# Patient Record
Sex: Male | Born: 1960 | Race: White | Hispanic: No | Marital: Married | Smoking: Current every day smoker
Health system: Southern US, Community
[De-identification: ages and names within clinical notes are randomized; demographics above are authoritative.]

## PROBLEM LIST (undated history)

## (undated) DIAGNOSIS — G709 Myoneural disorder, unspecified: Secondary | ICD-10-CM

## (undated) DIAGNOSIS — G629 Polyneuropathy, unspecified: Secondary | ICD-10-CM

## (undated) HISTORY — DX: Polyneuropathy, unspecified: G62.9

## (undated) HISTORY — DX: Myoneural disorder, unspecified: G70.9

---

## 2006-06-28 ENCOUNTER — Ambulatory Visit (HOSPITAL_BASED_OUTPATIENT_CLINIC_OR_DEPARTMENT_OTHER): Admission: RE | Admit: 2006-06-28 | Discharge: 2006-06-28 | Payer: Self-pay | Admitting: Family Medicine

## 2006-07-04 ENCOUNTER — Ambulatory Visit: Payer: Self-pay | Admitting: Internal Medicine

## 2006-08-27 ENCOUNTER — Ambulatory Visit (HOSPITAL_COMMUNITY): Admission: RE | Admit: 2006-08-27 | Discharge: 2006-08-27 | Payer: Self-pay | Admitting: Family Medicine

## 2009-07-05 ENCOUNTER — Observation Stay (HOSPITAL_COMMUNITY): Admission: AD | Admit: 2009-07-05 | Discharge: 2009-07-06 | Payer: Self-pay | Admitting: Internal Medicine

## 2009-07-05 ENCOUNTER — Ambulatory Visit: Payer: Self-pay | Admitting: Diagnostic Radiology

## 2009-07-05 ENCOUNTER — Encounter: Payer: Self-pay | Admitting: Emergency Medicine

## 2009-07-06 ENCOUNTER — Encounter (INDEPENDENT_AMBULATORY_CARE_PROVIDER_SITE_OTHER): Payer: Self-pay | Admitting: Internal Medicine

## 2010-04-14 LAB — BASIC METABOLIC PANEL
BUN: 10 mg/dL (ref 6–23)
BUN: 11 mg/dL (ref 6–23)
CO2: 25 mEq/L (ref 19–32)
Chloride: 103 mEq/L (ref 96–112)
Chloride: 106 mEq/L (ref 96–112)
Creatinine, Ser: 0.75 mg/dL (ref 0.4–1.5)
GFR calc Af Amer: >60 mL/min (ref >60)
GFR calc non Af Amer: >60 mL/min (ref >60)
Glucose, Bld: 143 mg/dL — ABNORMAL HIGH (ref 70–99)
Glucose, Bld: 94 mg/dL (ref 70–99)
Potassium: 3.9 mEq/L (ref 3.5–5.1)
Potassium: 4.2 mEq/L (ref 3.5–5.1)
Sodium: 142 mEq/L (ref 135–145)

## 2010-04-14 LAB — CARDIAC PANEL(CRET KIN+CKTOT+MB+TROPI)
CK, MB: 1.1 ng/mL (ref 0.3–4.0)
Relative Index: INVALID (ref 0.0–2.5)
Troponin I: 0.02 ng/mL (ref 0.00–0.06)
Troponin I: <0.01 ng/mL (ref 0.00–0.06)

## 2010-04-14 LAB — POCT CARDIAC MARKERS
CKMB, poc: <1 ng/mL — ABNORMAL LOW (ref 1.0–8.0)
Myoglobin, poc: 46.3 ng/mL (ref 12–200)
Troponin i, poc: <0.05 ng/mL (ref 0.00–0.09)

## 2010-04-14 LAB — LIPID PANEL
HDL: 27 mg/dL — ABNORMAL LOW (ref >39)
Total CHOL/HDL Ratio: 6.3 RATIO
Triglycerides: 619 mg/dL — ABNORMAL HIGH (ref <150)
VLDL: UNDETERMINED mg/dL (ref 0–40)

## 2010-04-14 LAB — DIFFERENTIAL
Basophils Absolute: 0.2 10*3/uL — ABNORMAL HIGH (ref 0.0–0.1)
Eosinophils Absolute: 0.2 10*3/uL (ref 0.0–0.7)
Eosinophils Relative: 2 % (ref 0–5)
Neutro Abs: 6.1 10*3/uL (ref 1.7–7.7)
Neutrophils Relative %: 71 % (ref 43–77)

## 2010-04-14 LAB — CBC
HCT: 41.7 % (ref 39.0–52.0)
HCT: 44.1 % (ref 39.0–52.0)
Hemoglobin: 14.3 g/dL (ref 13.0–17.0)
MCHC: 34.7 g/dL (ref 30.0–36.0)
MCV: 88 fL (ref 78.0–100.0)
MCV: 89.6 fL (ref 78.0–100.0)
Platelets: 208 10*3/uL (ref 150–400)
RBC: 4.66 MIL/uL (ref 4.22–5.81)
WBC: 9.5 10*3/uL (ref 4.0–10.5)

## 2010-04-14 LAB — TSH: TSH: 2.219 u[IU]/mL (ref 0.350–4.500)

## 2010-06-10 NOTE — Procedures (Signed)
NAME:  Glenn Estrada, Glenn Estrada               ACCOUNT NO.:  [id number]   MEDICAL RECORD NO.:  [id number]          PATIENT TYPE:  OUT   LOCATION:  SLEEP CENTER                 FACILITY:  [facility]   PHYSICIAN:  Clinton D. Maple Hudson, MD, FCCP, FACPDATE OF BIRTH:  1960-08-16   DATE OF STUDY:  06/28/2006                            NOCTURNAL POLYSOMNOGRAM   REFERRING PHYSICIAN:  Windle Guard, M.D.   INDICATION FOR STUDY:  Hypersomnia with sleep apnea.   EPWORTH SLEEPINESS SCORE:  11/24, BMI 36.3, weight 260 pounds.   MEDICATIONS:  No medications listed.   SLEEP ARCHITECTURE:  Total sleep time 341 minutes with sleep efficiency  81%.  Stage I was 11%, stage II 66%, stages III and IV absent, REM 22%  of total sleep time.  Sleep latency 21 minutes, REM latency 116 minutes,  awake after sleep onset 64 minutes, arousal index 9.5.  No bedtime  medication was taken.   RESPIRATORY DATA:  Split study protocol.  Apnea-hypopnea index (AHI,  RDI) 15.5 obstructive events per hour, indicating mild to moderate  obstructive sleep apnea/hypopnea syndrome before CPAP.  This includes 1  obstructive apnea and 37 hypopneas before CPAP.  The events were  positional, more common while supine.  REM AHI 24.3.  CPAP titration was  performed to a final pressure of 16 CWP, AHI 0 per hour.  There was  intermittent breakthrough snoring and residual obstructive events to  this final pressure, although pressures of 5 CWP and higher  substantially reduce the number of obstructive events.  A medium ResMed  Quattro mask was used with heated humidifier.   OXYGEN DATA:  Moderate to occasionally loud snoring with oxygen  desaturation to nadir of 87%.  Mean oxygen saturation after CPAP control  was 91-95% on room air.   CARDIAC DATA:  Normal sinus rhythm with occasional PAC.   MOVEMENT-PARASOMNIA:  Occasional limb jerk with arousal, nonsignificant.  No bathroom trips.   IMPRESSIONS-RECOMMENDATIONS:  1. Mild to moderate obstructive  sleep apnea/hypopnea syndrome, apnea-      hypopnea index 15.5 per hour.  Events were more common while      supine.  Moderate to occasionally loud snoring with oxygen      desaturation to a nadir of 87%.  2. CPAP was titrated to 16 CWP for complete control of snoring and      apneic events.  A medium ResMed Quattro      mask was used with heated humidifier.  Lower pressures may be      tried, if necessary, for patient comfort, accepting some      breakthrough snoring.      Clinton D. Maple Hudson, MD, [facility], FACP  Diplomate, Biomedical engineer of Sleep Medicine  Electronically Signed     CDY/MEDQ  D:  07/04/2006 10:14:10  T:  07/04/2006 15:14:55  Job:  [id number]

## 2011-03-13 IMAGING — CR DG CHEST 1V PORT
1 series · 1 of 1 positions shown · non-contrast
Comparison: None.

CLINICAL DATA: Midsternal chest pain, some nausea

PORTABLE CHEST - 1 VIEW

[view not recorded]
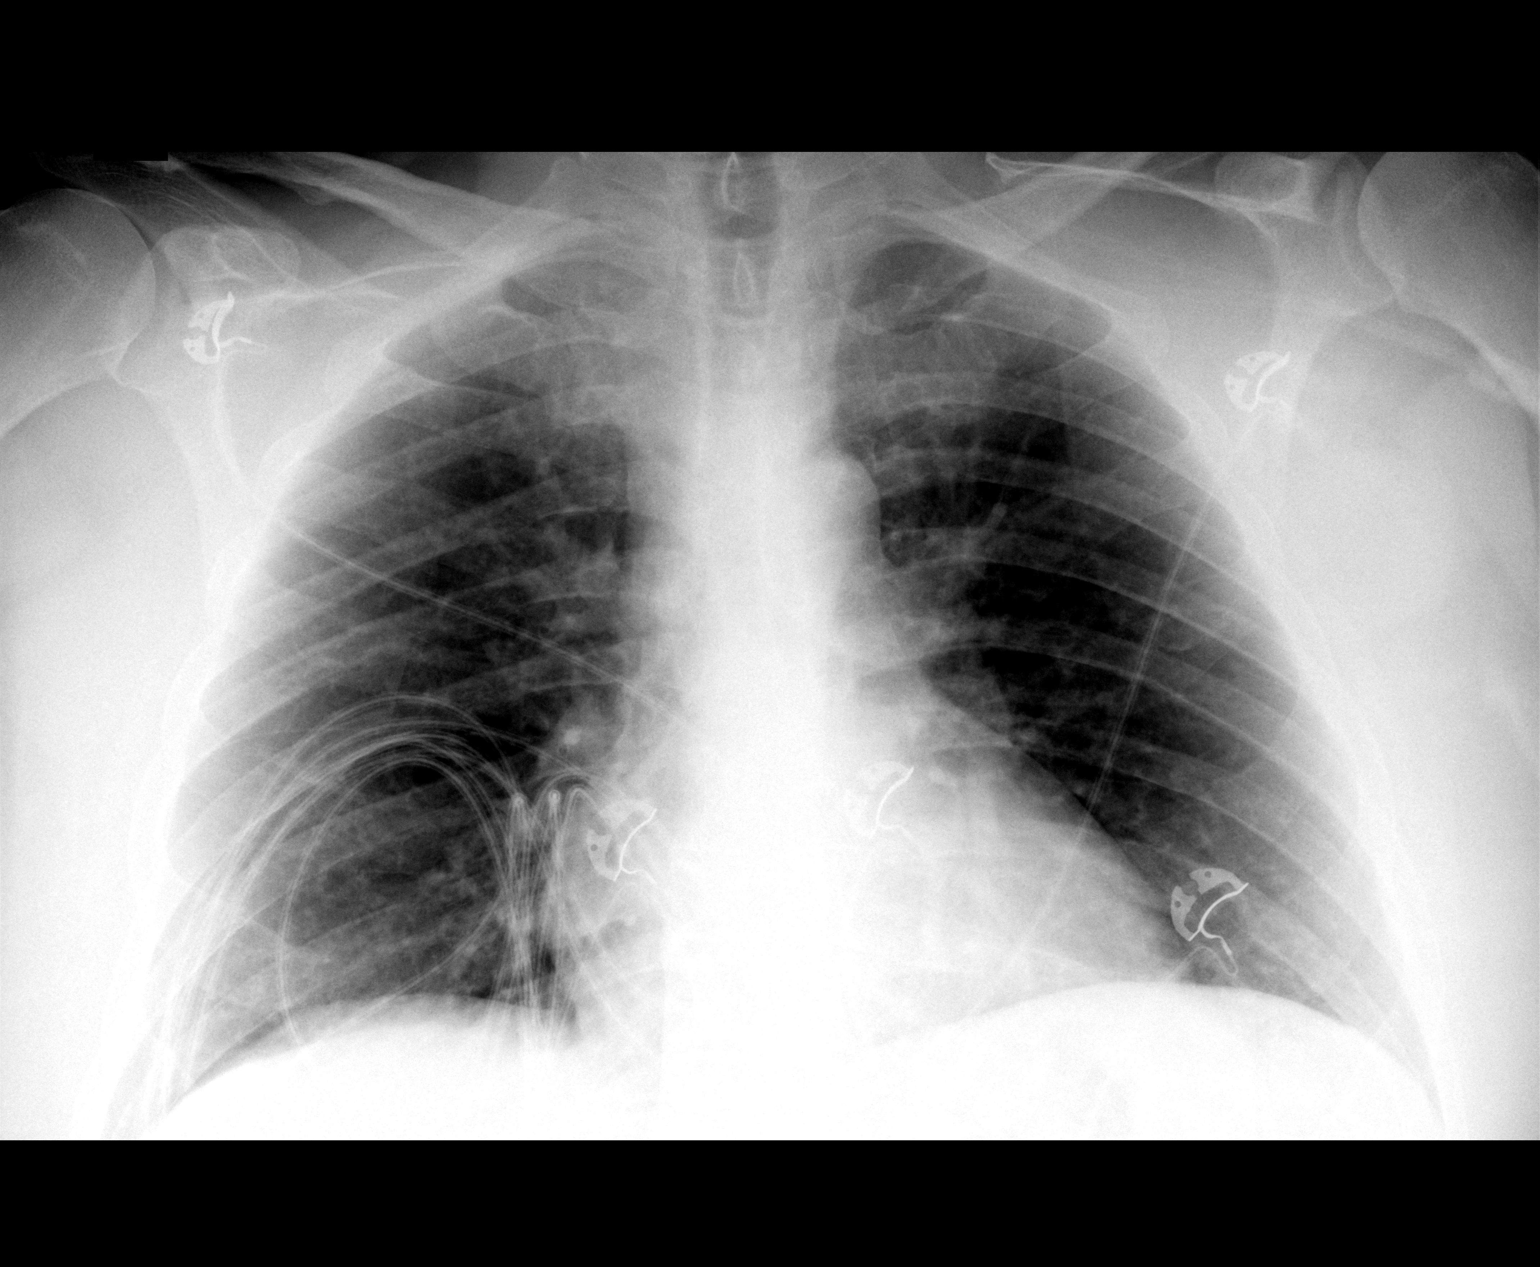

[1 of 1 positions shown; findings below may reference images not displayed]

FINDINGS: The lungs are clear.  The heart is within upper limits of
normal.  No acute bony abnormality is seen.
IMPRESSION: No active lung disease.

## 2015-02-20 DIAGNOSIS — Z6838 Body mass index (BMI) 38.0-38.9, adult: Secondary | ICD-10-CM | POA: Diagnosis not present

## 2015-02-20 DIAGNOSIS — F17200 Nicotine dependence, unspecified, uncomplicated: Secondary | ICD-10-CM | POA: Diagnosis not present

## 2015-02-20 DIAGNOSIS — Z1389 Encounter for screening for other disorder: Secondary | ICD-10-CM | POA: Diagnosis not present

## 2015-02-20 DIAGNOSIS — E1140 Type 2 diabetes mellitus with diabetic neuropathy, unspecified: Secondary | ICD-10-CM | POA: Diagnosis not present

## 2015-02-20 DIAGNOSIS — Z23 Encounter for immunization: Secondary | ICD-10-CM | POA: Diagnosis not present

## 2015-02-20 DIAGNOSIS — E1165 Type 2 diabetes mellitus with hyperglycemia: Secondary | ICD-10-CM | POA: Diagnosis not present

## 2015-04-23 DIAGNOSIS — E781 Pure hyperglyceridemia: Secondary | ICD-10-CM | POA: Diagnosis not present

## 2015-04-23 DIAGNOSIS — E1140 Type 2 diabetes mellitus with diabetic neuropathy, unspecified: Secondary | ICD-10-CM | POA: Diagnosis not present

## 2015-04-23 DIAGNOSIS — Z1389 Encounter for screening for other disorder: Secondary | ICD-10-CM | POA: Diagnosis not present

## 2015-04-23 DIAGNOSIS — Z125 Encounter for screening for malignant neoplasm of prostate: Secondary | ICD-10-CM | POA: Diagnosis not present

## 2015-04-23 DIAGNOSIS — E1165 Type 2 diabetes mellitus with hyperglycemia: Secondary | ICD-10-CM | POA: Diagnosis not present

## 2015-04-23 DIAGNOSIS — Z6839 Body mass index (BMI) 39.0-39.9, adult: Secondary | ICD-10-CM | POA: Diagnosis not present

## 2015-06-21 DIAGNOSIS — E1165 Type 2 diabetes mellitus with hyperglycemia: Secondary | ICD-10-CM | POA: Diagnosis not present

## 2015-06-21 DIAGNOSIS — E1140 Type 2 diabetes mellitus with diabetic neuropathy, unspecified: Secondary | ICD-10-CM | POA: Diagnosis not present

## 2015-06-21 DIAGNOSIS — E781 Pure hyperglyceridemia: Secondary | ICD-10-CM | POA: Diagnosis not present

## 2015-09-26 DIAGNOSIS — E1140 Type 2 diabetes mellitus with diabetic neuropathy, unspecified: Secondary | ICD-10-CM | POA: Diagnosis not present

## 2015-09-26 DIAGNOSIS — E781 Pure hyperglyceridemia: Secondary | ICD-10-CM | POA: Diagnosis not present

## 2015-09-26 DIAGNOSIS — E1165 Type 2 diabetes mellitus with hyperglycemia: Secondary | ICD-10-CM | POA: Diagnosis not present

## 2016-01-31 DIAGNOSIS — F17200 Nicotine dependence, unspecified, uncomplicated: Secondary | ICD-10-CM | POA: Diagnosis not present

## 2016-01-31 DIAGNOSIS — E781 Pure hyperglyceridemia: Secondary | ICD-10-CM | POA: Diagnosis not present

## 2016-01-31 DIAGNOSIS — E1165 Type 2 diabetes mellitus with hyperglycemia: Secondary | ICD-10-CM | POA: Diagnosis not present

## 2016-01-31 DIAGNOSIS — Z6836 Body mass index (BMI) 36.0-36.9, adult: Secondary | ICD-10-CM | POA: Diagnosis not present

## 2016-01-31 DIAGNOSIS — E1140 Type 2 diabetes mellitus with diabetic neuropathy, unspecified: Secondary | ICD-10-CM | POA: Diagnosis not present

## 2016-01-31 DIAGNOSIS — Z23 Encounter for immunization: Secondary | ICD-10-CM | POA: Diagnosis not present

## 2016-03-04 DIAGNOSIS — E1165 Type 2 diabetes mellitus with hyperglycemia: Secondary | ICD-10-CM | POA: Diagnosis not present

## 2016-03-04 DIAGNOSIS — Z794 Long term (current) use of insulin: Secondary | ICD-10-CM | POA: Diagnosis not present

## 2016-03-24 ENCOUNTER — Encounter: Payer: Self-pay | Admitting: Dietician

## 2016-03-24 ENCOUNTER — Encounter: Payer: Medicare HMO | Attending: Internal Medicine | Admitting: Dietician

## 2016-03-24 VITALS — BP 128/94 | Ht 71.0 in | Wt 262.9 lb

## 2016-03-24 DIAGNOSIS — Z713 Dietary counseling and surveillance: Secondary | ICD-10-CM | POA: Insufficient documentation

## 2016-03-24 DIAGNOSIS — E119 Type 2 diabetes mellitus without complications: Secondary | ICD-10-CM | POA: Diagnosis not present

## 2016-03-24 DIAGNOSIS — E1149 Type 2 diabetes mellitus with other diabetic neurological complication: Secondary | ICD-10-CM

## 2016-03-24 DIAGNOSIS — Z6836 Body mass index (BMI) 36.0-36.9, adult: Secondary | ICD-10-CM | POA: Insufficient documentation

## 2016-03-24 DIAGNOSIS — Z794 Long term (current) use of insulin: Secondary | ICD-10-CM

## 2016-03-24 NOTE — Progress Notes (Signed)
Diabetes Self-Management Education  Visit Type: First/Initial  Appt. Start Time: 0900 Appt. End Time:1015  03/24/2016  [name], identified by name and date of birth, is a 56 y.o. male with a diagnosis of Diabetes: Type 2.   ASSESSMENT  Blood pressure (!) 128/94, height 5\' 11"  (1.803 m), weight 262 lb 14.4 oz (119.3 kg). Body mass index is 36.67 kg/m. Lacks knowledge of diabetes care     Diabetes Self-Management Education - 03/24/16 1432      Visit Information   Visit Type First/Initial     Initial Visit   Diabetes Type Type 2     Health Coping   How would you rate your overall health? Poor     Psychosocial Assessment   Patient Belief/Attitude about Diabetes Motivated to manage diabetes   Self-care barriers None   Patient Concerns Glycemic Control;Weight Control;Other (comment);Medication  quit smoking; become more fit   Preferred Learning Style Auditory   Learning Readiness Ready   What is the last grade level you completed in school? 12     Pre-Education Assessment   Patient understands the diabetes disease and treatment process. Needs Review   Patient understands incorporating nutritional management into lifestyle. Needs Review   Patient undertands incorporating physical activity into lifestyle. Needs Review   Patient understands using medications safely. Needs Review   Patient understands monitoring blood glucose, interpreting and using results Needs Review   Patient understands prevention, detection, and treatment of acute complications. Needs Review   Patient understands prevention, detection, and treatment of chronic complications. Needs Review   Patient understands how to develop strategies to address psychosocial issues. Needs Review   Patient understands how to develop strategies to promote health/change behavior. Needs Review     Complications   Last HgB A1C per patient/outside source 11 %   How often do you check your blood sugar? > 4 times/day   uses [facility] meter and tests 6-7x/day   Fasting Blood glucose range (mg/dL) >409;811-914>200;180-200  FBG [id number] recently   Postprandial Blood glucose range (mg/dL) >621;308-657-QIONGEXBMW>200;180-200-occasional 300's   Number of hypoglycemic episodes per month 0   Number of hyperglycemic episodes per week --  100% time BG's are high   Have you had a dilated eye exam in the past 12 months? No  11-2014   Have you had a dental exam in the past 12 months? No  about 10 years ago   Are you checking your feet? Yes   How many days per week are you checking your feet? 7     Dietary Intake   Breakfast --  eats breakfast at 7a-8a   Snack (morning) --  none   Lunch --  eats lunch at 12p-1p; eats fried foods 2-3x/wk.   Snack (afternoon) --  none   Dinner --  eats supper at 6p-7p   Snack (evening) --  eats snack 9:30p-10p; eats snack foods 2-3x/wk.   Beverage(s) --  drinks water 6-7 glasses/day and sweet tea 2-3 glasses/day and occasional regular soda + milk 3 glasses/day     Exercise   Exercise Type Light (walking / raking leaves)  walks 30 min. 3-4x/wk-limited due to numbness and pain in BLE-uses cane for ambulation most of time     Patient Education   Previous Diabetes Education No   Disease state  Definition of diabetes, type 1 and 2, and the diagnosis of diabetes;Factors that contribute to the development of diabetes;Explored patient's options for treatment of their diabetes   Nutrition management  Role of diet in the treatment of diabetes and the relationship between the three main macronutrients and blood glucose level;Food label reading, portion sizes and measuring food.;Carbohydrate counting   Physical activity and exercise  Role of exercise on diabetes management, blood pressure control and cardiac health.;Helped patient identify appropriate exercises in relation to his/her diabetes, diabetes complications and other health issue.   Medications Taught/reviewed insulin injection, site rotation, insulin storage and  needle disposal.;Reviewed patients medication for diabetes, action, purpose, timing of dose and side effects.;Reviewed medication adjustment guidelines for hyperglycemia and sick days.   Monitoring Purpose and frequency of SMBG.;Taught/discussed recording of test results and interpretation of SMBG.;Identified appropriate SMBG and/or A1C goals.;Yearly dilated eye exam  reviewed use of  meter   Acute complications Taught treatment of hypoglycemia - the 15 rule.;Discussed and identified patients' treatment of hyperglycemia.   Chronic complications Relationship between chronic complications and blood glucose control;Identified and discussed with patient  current chronic complications;Dental care;Retinopathy and reason for yearly dilated eye exams;Reviewed with patient heart disease, higher risk of, and prevention;Nephropathy, what it is, prevention of, the use of ACE, ARB's and early detection of through urine microalbumia.   Psychosocial adjustment Role of stress on diabetes   Personal strategies to promote health Lifestyle issues that need to be addressed for better diabetes care;Helped patient develop diabetes management plan for (enter comment)     Outcomes   Expected Outcomes Demonstrated interest in learning. Expect positive outcomes      Individualized Plan for Diabetes Self-Management Training:   Learning Objective:  Patient will have a greater understanding of diabetes self-management. Patient education plan is to attend individual and/or group sessions per assessed needs and concerns.   Plan:   Patient Instructions   Check blood sugars 4 x day before each meal and before bed every day  Exercise: Try exercises on DVD and in exercise booklet for 15-20 minutes 3-4 days a week-ONLY exercise if BG's are <250  Avoid sugar sweetened drinks (soda, tea, coffee, sports drinks, juices)  Limit intake of fried foods and sweets  Drink lots of water  Eat 3 meals day,  1  snacks a day at  bedtime  Space meals 4-5 hours apart  Eat 3-4 carbohydrate servings/meal + protein  Eat 1 carbohydrate serving/snack + protein  Quit / Decrease smoking-encourage to attend smoking cessation classes  Make a dentist / eye doctor appointment  Bring blood sugar records to the next appointment/class  Get a Sharps container  Carry fast acting glucose and a snack at all times  Rotate injection sites  Return for appointment/classes on: 03-26-16   Expected Outcomes:  Demonstrated interest in learning. Expect positive outcomes  Education material provided: General meal planning guidelines, high and low BG handouts, Exercise DVD and booklet, smoking cessation class information  If problems or questions, patient to contact team via: [phone]  Future DSME appointment:  03-26-16

## 2016-03-24 NOTE — Patient Instructions (Signed)
  Check blood sugars 4 x day before each meal and before bed every day  Exercise: Try exercises on DVD and in exercise booklet for 15-20 minutes 3-4 days a week-ONLY exercise when BG's are <250  Avoid sugar sweetened drinks (soda, tea, coffee, sports drinks, juices)  Limit intake of fried foods and sweets  Drink lots of water  Eat 3 meals day,  1  snacks a day at bedtime  Space meals 4-5 hours apart  Eat 3-4 carbohydrate servings/meal + protein  Eat 1 carbohydrate serving/snack + protein  Quit / Decrease smoking  Make a dentist / eye doctor appointment  Bring blood sugar records to the next appointment/class  Get a Sharps container  Carry fast acting glucose and a snack at all times  Rotate injection sites  Return for appointment/classes on: 03-26-16

## 2016-03-26 ENCOUNTER — Encounter: Payer: Medicare HMO | Attending: Physician Assistant | Admitting: Dietician

## 2016-03-26 ENCOUNTER — Encounter: Payer: Self-pay | Admitting: Dietician

## 2016-03-26 VITALS — Ht 71.0 in | Wt 263.5 lb

## 2016-03-26 DIAGNOSIS — Z6836 Body mass index (BMI) 36.0-36.9, adult: Secondary | ICD-10-CM | POA: Insufficient documentation

## 2016-03-26 DIAGNOSIS — E119 Type 2 diabetes mellitus without complications: Secondary | ICD-10-CM | POA: Insufficient documentation

## 2016-03-26 DIAGNOSIS — E1149 Type 2 diabetes mellitus with other diabetic neurological complication: Secondary | ICD-10-CM

## 2016-03-26 DIAGNOSIS — Z713 Dietary counseling and surveillance: Secondary | ICD-10-CM | POA: Diagnosis not present

## 2016-03-26 DIAGNOSIS — Z794 Long term (current) use of insulin: Secondary | ICD-10-CM

## 2016-03-26 NOTE — Progress Notes (Signed)

## 2016-04-02 ENCOUNTER — Encounter: Payer: Medicare HMO | Admitting: *Deleted

## 2016-04-02 ENCOUNTER — Encounter: Payer: Self-pay | Admitting: *Deleted

## 2016-04-02 VITALS — Wt 262.3 lb

## 2016-04-02 DIAGNOSIS — Z794 Long term (current) use of insulin: Principal | ICD-10-CM

## 2016-04-02 DIAGNOSIS — E1149 Type 2 diabetes mellitus with other diabetic neurological complication: Secondary | ICD-10-CM

## 2016-04-02 DIAGNOSIS — Z713 Dietary counseling and surveillance: Secondary | ICD-10-CM | POA: Diagnosis not present

## 2016-04-02 DIAGNOSIS — E119 Type 2 diabetes mellitus without complications: Secondary | ICD-10-CM | POA: Diagnosis not present

## 2016-04-02 DIAGNOSIS — Z6836 Body mass index (BMI) 36.0-36.9, adult: Secondary | ICD-10-CM | POA: Diagnosis not present

## 2016-04-02 NOTE — Progress Notes (Signed)
Appt. Start Time: 0900 Appt. End Time: 1200  Class 2 Nutritional Management - identify sources of carbohydrate, protein and fat; plan balanced meals; estimate servings of carbohydrates in meals  Psychosocial - identify DM as a source of stress; state the effects of stress on BG control  Exercise - describe the effects of exercise on blood glucose and importance of regular exercise in controlling diabetes; state a plan for personal exercise; verbalize contraindications for exercise  Self-Monitoring - state importance of SMBG; use SMBG results to effectively manage diabetes; identify importance of regular HbA1C testing and goals for results  Acute Complications - recognize hyperglycemia and hypoglycemia with causes and effects; identify blood glucose results as high, low or in control; list steps in treating and preventing high and low blood glucose  Sick Day Guidelines: state appropriate measure to manage blood glucose when ill (need for meds, HBGM plan, when to call physician, need for fluids)  Chronic Complications/Foot, Skin, Eye Dental Care - identify possible long-term complications of diabetes (retinopathy, neuropathy, nephropathy, cardiovascular disease, infections); explain steps in prevention and treatment of chronic complications; state importance of daily self-foot exams; describe how to examine feet and what to look for; explain appropriate eye and dental care  Lifestyle Changes/Goals - state benefits of making appropriate lifestyle changes; identify habits that need to change (meals, tobacco, alcohol); identify strategies to reduce risk factors for personal health  Pregnancy/Sexual Health - state importance of good blood glucose control in preventing sexual problems (impotence, vaginal dryness, infections, loss of desire)  Teaching Materials Used: Class 2 Slide Packet A1C Pamphlet Foot Care Literature Kidney Test Handout Quick and "Balanced" Meal Ideas Carb Counting and Meal  Planning Book Goals for Class 2 

## 2016-04-06 DIAGNOSIS — Z794 Long term (current) use of insulin: Secondary | ICD-10-CM | POA: Diagnosis not present

## 2016-04-06 DIAGNOSIS — Z79899 Other long term (current) drug therapy: Secondary | ICD-10-CM | POA: Diagnosis not present

## 2016-04-06 DIAGNOSIS — E1165 Type 2 diabetes mellitus with hyperglycemia: Secondary | ICD-10-CM | POA: Diagnosis not present

## 2016-04-09 ENCOUNTER — Encounter: Payer: Self-pay | Admitting: Dietician

## 2016-04-09 ENCOUNTER — Encounter: Payer: Medicare HMO | Admitting: Dietician

## 2016-04-09 VITALS — BP 130/90 | Ht 71.0 in | Wt 264.2 lb

## 2016-04-09 DIAGNOSIS — Z794 Long term (current) use of insulin: Principal | ICD-10-CM

## 2016-04-09 DIAGNOSIS — Z6836 Body mass index (BMI) 36.0-36.9, adult: Secondary | ICD-10-CM | POA: Diagnosis not present

## 2016-04-09 DIAGNOSIS — E1149 Type 2 diabetes mellitus with other diabetic neurological complication: Secondary | ICD-10-CM

## 2016-04-09 DIAGNOSIS — Z713 Dietary counseling and surveillance: Secondary | ICD-10-CM | POA: Diagnosis not present

## 2016-04-09 DIAGNOSIS — E119 Type 2 diabetes mellitus without complications: Secondary | ICD-10-CM | POA: Diagnosis not present

## 2016-04-09 NOTE — Progress Notes (Signed)

## 2016-04-27 ENCOUNTER — Encounter: Payer: Self-pay | Admitting: Dietician

## 2016-04-27 NOTE — Progress Notes (Signed)
Previous progress note was entered as wrong class. Patient completed class 2 on 04/02/16. The 04/09/16 class was class 3.   Appt. Start Time: 0900 Appt. End Time: 1200  Class 3 Psychosocial - identify DM as a source of stress; state the effects of stress on BG control; verbalize appropriate stress management techniques; identify personal stress issues   Nutritional Management - describe effects of food on blood glucose; identify sources of carbohydrate, protein and fat; verbalize the importance of balance meals in controlling blood glucose; identify meals as well balanced or not; estimate servings of carbohydrate from menus; use food labels to identify servings size, content of carbohydrate, fiber, protein, fat, saturated fat and sodium; recognize food sources of fat, saturated fat, trans fat, sodium and verbalize goals for intake; describe healthful appropriate food choices when dining out   Exercise - describe the effects of exercise on blood glucose and importance of regular exercise in controlling diabetes; state a plan for personal exercise; verbalize contraindications for exercise  Self-Monitoring - state importance of HBGM and demo procedure accurately; use HBGM results to effectively manage diabetes; identify importance of regular HbA1C testing and goals for results  Acute Complications/Sick Day Guidelines - recognize hyperglycemia and hypoglycemia with causes and effects; identify blood glucose results as high, low or in control; list steps in treating and preventing high and low blood glucose; state appropriate measure to manage blood glucose when ill (need for meds, HBGM plan, when to call physician, need for fluids)  Chronic Complications/Foot, Skin, Eye Dental Care - identify possible long-term complications of diabetes (retinopathy, neuropathy, nephropathy, cardiovascular disease, infections); explain steps in prevention and treatment of chronic complications; state importance of daily  self-foot exams; describe how to examine feet and what to look for; explain appropriate eye and dental care  Lifestyle Changes/Goals & Health/Community Resources - state benefits of making appropriate lifestyle changes; identify habits that need to change (meals, tobacco, alcohol); identify strategies to reduce risk factors for personal health; set goals for proper diabetes care; state need for and frequency of healthcare follow-up; describe appropriate community resources for good health (ADA, web sites, apps)   Teaching Materials Used: Class 3 Slide Packet Diabetes Stress Test Stress Management Tools Stress Poem Recipe/Menu Booklet Nutrition Prescription Fast Food Information Goal Setting Worksheet     

## 2016-04-29 DIAGNOSIS — E119 Type 2 diabetes mellitus without complications: Secondary | ICD-10-CM | POA: Diagnosis not present

## 2016-05-26 DIAGNOSIS — Z6837 Body mass index (BMI) 37.0-37.9, adult: Secondary | ICD-10-CM | POA: Diagnosis not present

## 2016-05-26 DIAGNOSIS — Z125 Encounter for screening for malignant neoplasm of prostate: Secondary | ICD-10-CM | POA: Diagnosis not present

## 2016-05-26 DIAGNOSIS — E781 Pure hyperglyceridemia: Secondary | ICD-10-CM | POA: Diagnosis not present

## 2016-05-26 DIAGNOSIS — E669 Obesity, unspecified: Secondary | ICD-10-CM | POA: Diagnosis not present

## 2016-05-26 DIAGNOSIS — E1165 Type 2 diabetes mellitus with hyperglycemia: Secondary | ICD-10-CM | POA: Diagnosis not present

## 2016-06-02 DIAGNOSIS — E782 Mixed hyperlipidemia: Secondary | ICD-10-CM | POA: Diagnosis not present

## 2016-06-02 DIAGNOSIS — I10 Essential (primary) hypertension: Secondary | ICD-10-CM | POA: Diagnosis not present

## 2016-06-02 DIAGNOSIS — Z79899 Other long term (current) drug therapy: Secondary | ICD-10-CM | POA: Diagnosis not present

## 2016-06-02 DIAGNOSIS — Z794 Long term (current) use of insulin: Secondary | ICD-10-CM | POA: Diagnosis not present

## 2016-06-02 DIAGNOSIS — E1165 Type 2 diabetes mellitus with hyperglycemia: Secondary | ICD-10-CM | POA: Diagnosis not present

## 2016-10-23 DIAGNOSIS — E1169 Type 2 diabetes mellitus with other specified complication: Secondary | ICD-10-CM | POA: Diagnosis not present

## 2016-10-23 DIAGNOSIS — E1165 Type 2 diabetes mellitus with hyperglycemia: Secondary | ICD-10-CM | POA: Diagnosis not present

## 2016-10-23 DIAGNOSIS — Z794 Long term (current) use of insulin: Secondary | ICD-10-CM | POA: Diagnosis not present

## 2016-10-23 DIAGNOSIS — E1159 Type 2 diabetes mellitus with other circulatory complications: Secondary | ICD-10-CM | POA: Diagnosis not present

## 2016-10-23 DIAGNOSIS — I10 Essential (primary) hypertension: Secondary | ICD-10-CM | POA: Diagnosis not present

## 2016-10-23 DIAGNOSIS — E1142 Type 2 diabetes mellitus with diabetic polyneuropathy: Secondary | ICD-10-CM | POA: Diagnosis not present

## 2016-10-23 DIAGNOSIS — E785 Hyperlipidemia, unspecified: Secondary | ICD-10-CM | POA: Diagnosis not present

## 2017-01-11 DIAGNOSIS — E782 Mixed hyperlipidemia: Secondary | ICD-10-CM | POA: Diagnosis not present

## 2017-01-11 DIAGNOSIS — E1165 Type 2 diabetes mellitus with hyperglycemia: Secondary | ICD-10-CM | POA: Diagnosis not present

## 2017-01-11 DIAGNOSIS — Z79899 Other long term (current) drug therapy: Secondary | ICD-10-CM | POA: Diagnosis not present

## 2017-01-11 DIAGNOSIS — E1140 Type 2 diabetes mellitus with diabetic neuropathy, unspecified: Secondary | ICD-10-CM | POA: Diagnosis not present

## 2017-01-11 DIAGNOSIS — Z23 Encounter for immunization: Secondary | ICD-10-CM | POA: Diagnosis not present

## 2017-01-25 DIAGNOSIS — E785 Hyperlipidemia, unspecified: Secondary | ICD-10-CM | POA: Diagnosis not present

## 2017-01-25 DIAGNOSIS — I10 Essential (primary) hypertension: Secondary | ICD-10-CM | POA: Diagnosis not present

## 2017-01-25 DIAGNOSIS — E1159 Type 2 diabetes mellitus with other circulatory complications: Secondary | ICD-10-CM | POA: Diagnosis not present

## 2017-01-25 DIAGNOSIS — Z794 Long term (current) use of insulin: Secondary | ICD-10-CM | POA: Diagnosis not present

## 2017-01-25 DIAGNOSIS — E1142 Type 2 diabetes mellitus with diabetic polyneuropathy: Secondary | ICD-10-CM | POA: Diagnosis not present

## 2017-01-25 DIAGNOSIS — E1169 Type 2 diabetes mellitus with other specified complication: Secondary | ICD-10-CM | POA: Diagnosis not present

## 2017-02-04 DIAGNOSIS — Z6837 Body mass index (BMI) 37.0-37.9, adult: Secondary | ICD-10-CM | POA: Diagnosis not present

## 2017-02-04 DIAGNOSIS — E1165 Type 2 diabetes mellitus with hyperglycemia: Secondary | ICD-10-CM | POA: Diagnosis not present

## 2017-02-04 DIAGNOSIS — M1712 Unilateral primary osteoarthritis, left knee: Secondary | ICD-10-CM | POA: Diagnosis not present

## 2017-02-05 DIAGNOSIS — M1712 Unilateral primary osteoarthritis, left knee: Secondary | ICD-10-CM | POA: Diagnosis not present

## 2017-04-21 DIAGNOSIS — I10 Essential (primary) hypertension: Secondary | ICD-10-CM | POA: Diagnosis not present

## 2017-04-21 DIAGNOSIS — E1169 Type 2 diabetes mellitus with other specified complication: Secondary | ICD-10-CM | POA: Diagnosis not present

## 2017-04-21 DIAGNOSIS — E1159 Type 2 diabetes mellitus with other circulatory complications: Secondary | ICD-10-CM | POA: Diagnosis not present

## 2017-04-21 DIAGNOSIS — E1142 Type 2 diabetes mellitus with diabetic polyneuropathy: Secondary | ICD-10-CM | POA: Diagnosis not present

## 2017-04-21 DIAGNOSIS — E785 Hyperlipidemia, unspecified: Secondary | ICD-10-CM | POA: Diagnosis not present

## 2017-04-21 DIAGNOSIS — Z794 Long term (current) use of insulin: Secondary | ICD-10-CM | POA: Diagnosis not present

## 2017-07-07 DIAGNOSIS — H524 Presbyopia: Secondary | ICD-10-CM | POA: Diagnosis not present

## 2017-07-14 DIAGNOSIS — E1140 Type 2 diabetes mellitus with diabetic neuropathy, unspecified: Secondary | ICD-10-CM | POA: Diagnosis not present

## 2017-07-14 DIAGNOSIS — E782 Mixed hyperlipidemia: Secondary | ICD-10-CM | POA: Diagnosis not present

## 2017-07-14 DIAGNOSIS — Z1331 Encounter for screening for depression: Secondary | ICD-10-CM | POA: Diagnosis not present

## 2017-07-14 DIAGNOSIS — Z125 Encounter for screening for malignant neoplasm of prostate: Secondary | ICD-10-CM | POA: Diagnosis not present

## 2017-07-14 DIAGNOSIS — Z6837 Body mass index (BMI) 37.0-37.9, adult: Secondary | ICD-10-CM | POA: Diagnosis not present

## 2017-07-14 DIAGNOSIS — Z1339 Encounter for screening examination for other mental health and behavioral disorders: Secondary | ICD-10-CM | POA: Diagnosis not present

## 2017-07-14 DIAGNOSIS — Z87891 Personal history of nicotine dependence: Secondary | ICD-10-CM | POA: Diagnosis not present

## 2017-08-19 DIAGNOSIS — E1159 Type 2 diabetes mellitus with other circulatory complications: Secondary | ICD-10-CM | POA: Diagnosis not present

## 2017-08-19 DIAGNOSIS — Z794 Long term (current) use of insulin: Secondary | ICD-10-CM | POA: Diagnosis not present

## 2017-08-19 DIAGNOSIS — E1142 Type 2 diabetes mellitus with diabetic polyneuropathy: Secondary | ICD-10-CM | POA: Diagnosis not present

## 2017-08-19 DIAGNOSIS — E785 Hyperlipidemia, unspecified: Secondary | ICD-10-CM | POA: Diagnosis not present

## 2017-08-19 DIAGNOSIS — I10 Essential (primary) hypertension: Secondary | ICD-10-CM | POA: Diagnosis not present

## 2017-08-19 DIAGNOSIS — E1169 Type 2 diabetes mellitus with other specified complication: Secondary | ICD-10-CM | POA: Diagnosis not present

## 2017-08-26 DIAGNOSIS — E1142 Type 2 diabetes mellitus with diabetic polyneuropathy: Secondary | ICD-10-CM | POA: Diagnosis not present

## 2017-08-26 DIAGNOSIS — E785 Hyperlipidemia, unspecified: Secondary | ICD-10-CM | POA: Diagnosis not present

## 2017-08-26 DIAGNOSIS — Z794 Long term (current) use of insulin: Secondary | ICD-10-CM | POA: Diagnosis not present

## 2017-08-26 DIAGNOSIS — I10 Essential (primary) hypertension: Secondary | ICD-10-CM | POA: Diagnosis not present

## 2017-08-26 DIAGNOSIS — E1169 Type 2 diabetes mellitus with other specified complication: Secondary | ICD-10-CM | POA: Diagnosis not present

## 2017-08-26 DIAGNOSIS — E1159 Type 2 diabetes mellitus with other circulatory complications: Secondary | ICD-10-CM | POA: Diagnosis not present

## 2017-11-03 ENCOUNTER — Other Ambulatory Visit: Payer: Self-pay | Admitting: Neurology

## 2017-11-03 DIAGNOSIS — M545 Low back pain: Secondary | ICD-10-CM | POA: Diagnosis not present

## 2017-11-03 DIAGNOSIS — G8929 Other chronic pain: Secondary | ICD-10-CM

## 2017-11-03 DIAGNOSIS — R2689 Other abnormalities of gait and mobility: Secondary | ICD-10-CM | POA: Diagnosis not present

## 2017-11-03 DIAGNOSIS — E1142 Type 2 diabetes mellitus with diabetic polyneuropathy: Secondary | ICD-10-CM | POA: Diagnosis not present

## 2017-11-03 DIAGNOSIS — E559 Vitamin D deficiency, unspecified: Secondary | ICD-10-CM | POA: Diagnosis not present

## 2017-11-03 DIAGNOSIS — M5442 Lumbago with sciatica, left side: Principal | ICD-10-CM

## 2017-11-03 DIAGNOSIS — M5441 Lumbago with sciatica, right side: Principal | ICD-10-CM

## 2017-11-10 DIAGNOSIS — R200 Anesthesia of skin: Secondary | ICD-10-CM | POA: Diagnosis not present

## 2017-11-10 DIAGNOSIS — R202 Paresthesia of skin: Secondary | ICD-10-CM | POA: Diagnosis not present

## 2017-11-14 ENCOUNTER — Ambulatory Visit: Payer: Medicare HMO

## 2018-01-20 DIAGNOSIS — E782 Mixed hyperlipidemia: Secondary | ICD-10-CM | POA: Diagnosis not present

## 2018-01-20 DIAGNOSIS — Z794 Long term (current) use of insulin: Secondary | ICD-10-CM | POA: Diagnosis not present

## 2018-01-20 DIAGNOSIS — Z6827 Body mass index (BMI) 27.0-27.9, adult: Secondary | ICD-10-CM | POA: Diagnosis not present

## 2018-01-20 DIAGNOSIS — E1140 Type 2 diabetes mellitus with diabetic neuropathy, unspecified: Secondary | ICD-10-CM | POA: Diagnosis not present

## 2018-01-20 DIAGNOSIS — E1165 Type 2 diabetes mellitus with hyperglycemia: Secondary | ICD-10-CM | POA: Diagnosis not present

## 2018-01-20 DIAGNOSIS — F329 Major depressive disorder, single episode, unspecified: Secondary | ICD-10-CM | POA: Diagnosis not present

## 2018-01-20 DIAGNOSIS — M5416 Radiculopathy, lumbar region: Secondary | ICD-10-CM | POA: Diagnosis not present

## 2018-02-28 DIAGNOSIS — Z125 Encounter for screening for malignant neoplasm of prostate: Secondary | ICD-10-CM | POA: Diagnosis not present

## 2018-02-28 DIAGNOSIS — Z6839 Body mass index (BMI) 39.0-39.9, adult: Secondary | ICD-10-CM | POA: Diagnosis not present

## 2018-02-28 DIAGNOSIS — Z0000 Encounter for general adult medical examination without abnormal findings: Secondary | ICD-10-CM | POA: Diagnosis not present

## 2018-02-28 DIAGNOSIS — Z1331 Encounter for screening for depression: Secondary | ICD-10-CM | POA: Diagnosis not present

## 2018-02-28 DIAGNOSIS — F17200 Nicotine dependence, unspecified, uncomplicated: Secondary | ICD-10-CM | POA: Diagnosis not present

## 2018-02-28 DIAGNOSIS — E785 Hyperlipidemia, unspecified: Secondary | ICD-10-CM | POA: Diagnosis not present

## 2018-02-28 DIAGNOSIS — Z1211 Encounter for screening for malignant neoplasm of colon: Secondary | ICD-10-CM | POA: Diagnosis not present

## 2018-02-28 DIAGNOSIS — Z9181 History of falling: Secondary | ICD-10-CM | POA: Diagnosis not present

## 2018-02-28 DIAGNOSIS — F329 Major depressive disorder, single episode, unspecified: Secondary | ICD-10-CM | POA: Diagnosis not present

## 2018-03-29 DIAGNOSIS — E1140 Type 2 diabetes mellitus with diabetic neuropathy, unspecified: Secondary | ICD-10-CM | POA: Diagnosis not present

## 2018-03-29 DIAGNOSIS — F17200 Nicotine dependence, unspecified, uncomplicated: Secondary | ICD-10-CM | POA: Diagnosis not present

## 2018-03-29 DIAGNOSIS — E1165 Type 2 diabetes mellitus with hyperglycemia: Secondary | ICD-10-CM | POA: Diagnosis not present

## 2018-03-29 DIAGNOSIS — Z6841 Body Mass Index (BMI) 40.0 and over, adult: Secondary | ICD-10-CM | POA: Diagnosis not present

## 2018-03-29 DIAGNOSIS — Z794 Long term (current) use of insulin: Secondary | ICD-10-CM | POA: Diagnosis not present

## 2018-03-29 DIAGNOSIS — F324 Major depressive disorder, single episode, in partial remission: Secondary | ICD-10-CM | POA: Diagnosis not present

## 2018-05-30 DIAGNOSIS — E1140 Type 2 diabetes mellitus with diabetic neuropathy, unspecified: Secondary | ICD-10-CM | POA: Diagnosis not present

## 2018-05-30 DIAGNOSIS — E1165 Type 2 diabetes mellitus with hyperglycemia: Secondary | ICD-10-CM | POA: Diagnosis not present

## 2018-05-30 DIAGNOSIS — F324 Major depressive disorder, single episode, in partial remission: Secondary | ICD-10-CM | POA: Diagnosis not present

## 2018-05-30 DIAGNOSIS — F17200 Nicotine dependence, unspecified, uncomplicated: Secondary | ICD-10-CM | POA: Diagnosis not present
